# Patient Record
Sex: Female | Born: 1970 | Race: White | Hispanic: Yes | Smoking: Never smoker
Health system: Southern US, Community
[De-identification: ages and names within clinical notes are randomized; demographics above are authoritative.]

## PROBLEM LIST (undated history)

## (undated) DIAGNOSIS — K219 Gastro-esophageal reflux disease without esophagitis: Secondary | ICD-10-CM

## (undated) DIAGNOSIS — I82409 Acute embolism and thrombosis of unspecified deep veins of unspecified lower extremity: Secondary | ICD-10-CM

## (undated) DIAGNOSIS — E119 Type 2 diabetes mellitus without complications: Secondary | ICD-10-CM

## (undated) DIAGNOSIS — E785 Hyperlipidemia, unspecified: Secondary | ICD-10-CM

## (undated) HISTORY — DX: Gastro-esophageal reflux disease without esophagitis: K21.9

## (undated) HISTORY — DX: Type 2 diabetes mellitus without complications: E11.9

## (undated) HISTORY — DX: Hyperlipidemia, unspecified: E78.5

## (undated) HISTORY — PX: PARTIAL HYSTERECTOMY: SHX80

## (undated) HISTORY — DX: Acute embolism and thrombosis of unspecified deep veins of unspecified lower extremity: I82.409

## (undated) HISTORY — PX: VENA CAVA PLICATION: SHX2654

## (undated) HISTORY — PX: TUBAL LIGATION: SHX77

---

## 2019-05-05 DIAGNOSIS — R8290 Unspecified abnormal findings in urine: Secondary | ICD-10-CM | POA: Diagnosis not present

## 2019-05-05 DIAGNOSIS — Z7901 Long term (current) use of anticoagulants: Secondary | ICD-10-CM | POA: Diagnosis not present

## 2019-05-05 DIAGNOSIS — D6859 Other primary thrombophilia: Secondary | ICD-10-CM | POA: Diagnosis not present

## 2019-05-05 DIAGNOSIS — Z5181 Encounter for therapeutic drug level monitoring: Secondary | ICD-10-CM | POA: Diagnosis not present

## 2019-05-05 DIAGNOSIS — E119 Type 2 diabetes mellitus without complications: Secondary | ICD-10-CM | POA: Diagnosis not present

## 2019-06-16 DIAGNOSIS — D6859 Other primary thrombophilia: Secondary | ICD-10-CM | POA: Diagnosis not present

## 2019-06-16 DIAGNOSIS — Z7901 Long term (current) use of anticoagulants: Secondary | ICD-10-CM | POA: Diagnosis not present

## 2019-06-16 DIAGNOSIS — Z5181 Encounter for therapeutic drug level monitoring: Secondary | ICD-10-CM | POA: Diagnosis not present

## 2019-06-16 DIAGNOSIS — R300 Dysuria: Secondary | ICD-10-CM | POA: Diagnosis not present

## 2019-07-21 DIAGNOSIS — Z5181 Encounter for therapeutic drug level monitoring: Secondary | ICD-10-CM | POA: Diagnosis not present

## 2019-07-21 DIAGNOSIS — Z6827 Body mass index (BMI) 27.0-27.9, adult: Secondary | ICD-10-CM | POA: Diagnosis not present

## 2019-07-21 DIAGNOSIS — D6859 Other primary thrombophilia: Secondary | ICD-10-CM | POA: Diagnosis not present

## 2019-07-21 DIAGNOSIS — Z7901 Long term (current) use of anticoagulants: Secondary | ICD-10-CM | POA: Diagnosis not present

## 2019-07-21 DIAGNOSIS — E782 Mixed hyperlipidemia: Secondary | ICD-10-CM | POA: Diagnosis not present

## 2019-07-21 DIAGNOSIS — E119 Type 2 diabetes mellitus without complications: Secondary | ICD-10-CM | POA: Diagnosis not present

## 2019-08-18 DIAGNOSIS — Z7901 Long term (current) use of anticoagulants: Secondary | ICD-10-CM | POA: Diagnosis not present

## 2019-08-18 DIAGNOSIS — E782 Mixed hyperlipidemia: Secondary | ICD-10-CM | POA: Diagnosis not present

## 2019-08-18 DIAGNOSIS — E119 Type 2 diabetes mellitus without complications: Secondary | ICD-10-CM | POA: Diagnosis not present

## 2019-08-18 DIAGNOSIS — D6859 Other primary thrombophilia: Secondary | ICD-10-CM | POA: Diagnosis not present

## 2019-08-18 DIAGNOSIS — Z5181 Encounter for therapeutic drug level monitoring: Secondary | ICD-10-CM | POA: Diagnosis not present

## 2019-09-01 DIAGNOSIS — D6859 Other primary thrombophilia: Secondary | ICD-10-CM | POA: Diagnosis not present

## 2019-09-01 DIAGNOSIS — Z6828 Body mass index (BMI) 28.0-28.9, adult: Secondary | ICD-10-CM | POA: Diagnosis not present

## 2019-09-01 DIAGNOSIS — Z5181 Encounter for therapeutic drug level monitoring: Secondary | ICD-10-CM | POA: Diagnosis not present

## 2019-09-01 DIAGNOSIS — Z7901 Long term (current) use of anticoagulants: Secondary | ICD-10-CM | POA: Diagnosis not present

## 2019-09-05 DIAGNOSIS — Z5181 Encounter for therapeutic drug level monitoring: Secondary | ICD-10-CM | POA: Diagnosis not present

## 2019-09-05 DIAGNOSIS — D6859 Other primary thrombophilia: Secondary | ICD-10-CM | POA: Diagnosis not present

## 2019-09-05 DIAGNOSIS — Z7901 Long term (current) use of anticoagulants: Secondary | ICD-10-CM | POA: Diagnosis not present

## 2019-10-03 DIAGNOSIS — Z5181 Encounter for therapeutic drug level monitoring: Secondary | ICD-10-CM | POA: Diagnosis not present

## 2019-10-03 DIAGNOSIS — Z6829 Body mass index (BMI) 29.0-29.9, adult: Secondary | ICD-10-CM | POA: Diagnosis not present

## 2019-10-03 DIAGNOSIS — Z7901 Long term (current) use of anticoagulants: Secondary | ICD-10-CM | POA: Diagnosis not present

## 2019-10-03 DIAGNOSIS — D6859 Other primary thrombophilia: Secondary | ICD-10-CM | POA: Diagnosis not present

## 2019-10-31 DIAGNOSIS — Z5181 Encounter for therapeutic drug level monitoring: Secondary | ICD-10-CM | POA: Diagnosis not present

## 2019-10-31 DIAGNOSIS — Z7901 Long term (current) use of anticoagulants: Secondary | ICD-10-CM | POA: Diagnosis not present

## 2019-10-31 DIAGNOSIS — D6859 Other primary thrombophilia: Secondary | ICD-10-CM | POA: Diagnosis not present

## 2019-10-31 DIAGNOSIS — Z6828 Body mass index (BMI) 28.0-28.9, adult: Secondary | ICD-10-CM | POA: Diagnosis not present

## 2019-12-05 DIAGNOSIS — Z6829 Body mass index (BMI) 29.0-29.9, adult: Secondary | ICD-10-CM | POA: Diagnosis not present

## 2019-12-05 DIAGNOSIS — Z5181 Encounter for therapeutic drug level monitoring: Secondary | ICD-10-CM | POA: Diagnosis not present

## 2019-12-05 DIAGNOSIS — Z23 Encounter for immunization: Secondary | ICD-10-CM | POA: Diagnosis not present

## 2019-12-05 DIAGNOSIS — D6859 Other primary thrombophilia: Secondary | ICD-10-CM | POA: Diagnosis not present

## 2019-12-05 DIAGNOSIS — Z7901 Long term (current) use of anticoagulants: Secondary | ICD-10-CM | POA: Diagnosis not present

## 2019-12-05 DIAGNOSIS — E782 Mixed hyperlipidemia: Secondary | ICD-10-CM | POA: Diagnosis not present

## 2019-12-05 DIAGNOSIS — E119 Type 2 diabetes mellitus without complications: Secondary | ICD-10-CM | POA: Diagnosis not present

## 2020-05-06 DIAGNOSIS — Z6828 Body mass index (BMI) 28.0-28.9, adult: Secondary | ICD-10-CM | POA: Diagnosis not present

## 2020-05-06 DIAGNOSIS — R300 Dysuria: Secondary | ICD-10-CM | POA: Diagnosis not present

## 2020-05-06 DIAGNOSIS — R509 Fever, unspecified: Secondary | ICD-10-CM | POA: Diagnosis not present

## 2020-05-06 DIAGNOSIS — Z20822 Contact with and (suspected) exposure to covid-19: Secondary | ICD-10-CM | POA: Diagnosis not present

## 2020-05-14 DIAGNOSIS — E119 Type 2 diabetes mellitus without complications: Secondary | ICD-10-CM | POA: Diagnosis not present

## 2020-05-14 DIAGNOSIS — E782 Mixed hyperlipidemia: Secondary | ICD-10-CM | POA: Diagnosis not present

## 2020-05-24 DIAGNOSIS — D6859 Other primary thrombophilia: Secondary | ICD-10-CM | POA: Diagnosis not present

## 2020-05-24 DIAGNOSIS — E1169 Type 2 diabetes mellitus with other specified complication: Secondary | ICD-10-CM | POA: Diagnosis not present

## 2020-05-24 DIAGNOSIS — Z20822 Contact with and (suspected) exposure to covid-19: Secondary | ICD-10-CM | POA: Diagnosis not present

## 2020-05-24 DIAGNOSIS — Z23 Encounter for immunization: Secondary | ICD-10-CM | POA: Diagnosis not present

## 2020-05-24 DIAGNOSIS — R059 Cough, unspecified: Secondary | ICD-10-CM | POA: Diagnosis not present

## 2020-05-24 DIAGNOSIS — E782 Mixed hyperlipidemia: Secondary | ICD-10-CM | POA: Diagnosis not present

## 2020-05-24 DIAGNOSIS — E785 Hyperlipidemia, unspecified: Secondary | ICD-10-CM | POA: Diagnosis not present

## 2020-09-24 DIAGNOSIS — E782 Mixed hyperlipidemia: Secondary | ICD-10-CM | POA: Diagnosis not present

## 2020-09-24 DIAGNOSIS — E1169 Type 2 diabetes mellitus with other specified complication: Secondary | ICD-10-CM | POA: Diagnosis not present

## 2020-10-04 DIAGNOSIS — E782 Mixed hyperlipidemia: Secondary | ICD-10-CM | POA: Diagnosis not present

## 2020-10-04 DIAGNOSIS — E1169 Type 2 diabetes mellitus with other specified complication: Secondary | ICD-10-CM | POA: Diagnosis not present

## 2020-10-04 DIAGNOSIS — E785 Hyperlipidemia, unspecified: Secondary | ICD-10-CM | POA: Diagnosis not present

## 2020-10-04 DIAGNOSIS — D6859 Other primary thrombophilia: Secondary | ICD-10-CM | POA: Diagnosis not present

## 2021-01-21 DIAGNOSIS — E1169 Type 2 diabetes mellitus with other specified complication: Secondary | ICD-10-CM | POA: Diagnosis not present

## 2021-01-21 DIAGNOSIS — E782 Mixed hyperlipidemia: Secondary | ICD-10-CM | POA: Diagnosis not present

## 2021-02-16 DIAGNOSIS — Z6828 Body mass index (BMI) 28.0-28.9, adult: Secondary | ICD-10-CM | POA: Diagnosis not present

## 2021-02-16 DIAGNOSIS — Z0000 Encounter for general adult medical examination without abnormal findings: Secondary | ICD-10-CM | POA: Diagnosis not present

## 2021-02-16 DIAGNOSIS — Z1211 Encounter for screening for malignant neoplasm of colon: Secondary | ICD-10-CM | POA: Diagnosis not present

## 2021-02-16 DIAGNOSIS — Z1231 Encounter for screening mammogram for malignant neoplasm of breast: Secondary | ICD-10-CM | POA: Diagnosis not present

## 2021-03-16 ENCOUNTER — Other Ambulatory Visit: Payer: Self-pay | Admitting: Family Medicine

## 2021-03-16 DIAGNOSIS — Z1231 Encounter for screening mammogram for malignant neoplasm of breast: Secondary | ICD-10-CM

## 2021-03-23 ENCOUNTER — Ambulatory Visit
Admission: RE | Admit: 2021-03-23 | Discharge: 2021-03-23 | Disposition: A | Discharge status: Home / Self Care | Payer: BC Managed Care – PPO | Source: Ambulatory Visit | Attending: Family Medicine | Admitting: Family Medicine

## 2021-03-23 ENCOUNTER — Other Ambulatory Visit: Payer: Self-pay

## 2021-03-23 ENCOUNTER — Telehealth: Payer: Self-pay

## 2021-03-23 DIAGNOSIS — Z1231 Encounter for screening mammogram for malignant neoplasm of breast: Secondary | ICD-10-CM | POA: Diagnosis not present

## 2021-05-19 DIAGNOSIS — E1169 Type 2 diabetes mellitus with other specified complication: Secondary | ICD-10-CM | POA: Diagnosis not present

## 2021-06-08 DIAGNOSIS — R319 Hematuria, unspecified: Secondary | ICD-10-CM | POA: Diagnosis not present

## 2021-06-08 DIAGNOSIS — Z23 Encounter for immunization: Secondary | ICD-10-CM | POA: Diagnosis not present

## 2021-06-08 DIAGNOSIS — E1169 Type 2 diabetes mellitus with other specified complication: Secondary | ICD-10-CM | POA: Diagnosis not present

## 2021-06-08 DIAGNOSIS — Z1331 Encounter for screening for depression: Secondary | ICD-10-CM | POA: Diagnosis not present

## 2021-06-08 DIAGNOSIS — E785 Hyperlipidemia, unspecified: Secondary | ICD-10-CM | POA: Diagnosis not present

## 2021-06-08 DIAGNOSIS — E782 Mixed hyperlipidemia: Secondary | ICD-10-CM | POA: Diagnosis not present

## 2021-07-20 ENCOUNTER — Telehealth: Payer: Self-pay | Admitting: Oncology

## 2021-07-20 NOTE — Telephone Encounter (Signed)
Pt has been scheduled. Aware of appt date and time.

## 2021-08-03 NOTE — Progress Notes (Signed)
[facility]  [address] [place],  [place]  [address] [phone]  Clinic Day:  08/04/2021  Referring physician: Nehemiah Settle, MD   HISTORY OF PRESENT ILLNESS:  The patient is a 51 y.o. female who I was asked to consult upon for anticoagulation management in the setting of an upcoming procedure.  This patient has a history of recurrent DVTs.  In 1994, she recalls developing an unprovoked blood clot, which led to her being placed on Coumadin for approximately 1 year.  In 1999, a second unprovoked blood clot developed, which led to her being placed back on Coumadin.  Furthermore, an IVC filter was placed due to her second blood clot developing in an unprovoked manner.  She was on Coumadin for numerous years before being switched to Xarelto approximately 2 years ago.  The patient is scheduled to undergo a colonoscopy in the forthcoming weeks.  This visit was primarily made to determine how best to withdraw her anticoagulation in the setting of that upcoming procedure.  With respect to her blood clots, she has not another blood clot since 1999.  To her knowledge, there is no family history of blood clots.  Although she is scheduled for an upcoming colonoscopy, she denies having a history of rectal bleeding, anemia, or other symptoms/findings which concern her for a lower GI tract process being present.  PAST MEDICAL HISTORY:   Past Medical History:  Diagnosis Date   Diabetes mellitus without complication (HCC)    DVT (deep venous thrombosis) (HCC)    GERD (gastroesophageal reflux disease)    Hyperlipidemia     PAST SURGICAL HISTORY:   Past Surgical History:  Procedure Laterality Date   CESAREAN SECTION     X 2   PARTIAL HYSTERECTOMY     TUBAL LIGATION Bilateral    VENA CAVA PLICATION      CURRENT MEDICATIONS:   Current Outpatient Medications  Medication Sig Dispense Refill   atorvastatin (LIPITOR) 10 MG tablet Take 10 mg by mouth daily.      JARDIANCE 25 MG TABS tablet Take 25 mg by mouth daily.     metFORMIN (GLUCOPHAGE) 500 MG tablet Take 500 mg by mouth 2 (two) times daily.     pantoprazole (PROTONIX) 40 MG tablet Take 40 mg by mouth daily.     XARELTO 10 MG TABS tablet Take 10 mg by mouth daily.     No current facility-administered medications for this visit.    ALLERGIES:  No Known Allergies  FAMILY HISTORY:   Family History  Problem Relation Age of Onset   Diabetes Mother    Parkinson's disease Father    Thyroid disease Sister    Breast cancer Neg Hx     SOCIAL HISTORY:  The patient was born and raised in [place].  She currently lives in town.  She has separated, with 2 children.  She does sewing work.  There is no history of alcoholism or tobacco abuse.  REVIEW OF SYSTEMS:  Review of Systems  Constitutional:  Negative for fatigue and fever.  HENT:   Negative for hearing loss and sore throat.   Eyes:  Negative for eye problems.  Respiratory:  Negative for chest tightness, cough and hemoptysis.   Cardiovascular:  Negative for chest pain and palpitations.  Gastrointestinal:  Negative for abdominal distention, abdominal pain, blood in stool, constipation, diarrhea, nausea and vomiting.  Endocrine: Negative for hot flashes.  Genitourinary:  Negative for difficulty urinating, dysuria, frequency, hematuria and nocturia.  Musculoskeletal:  Negative for arthralgias, back pain, gait problem and myalgias.  Skin: Negative.  Negative for itching and rash.  Neurological: Negative.  Negative for dizziness, extremity weakness, gait problem, headaches, light-headedness and numbness.  Hematological: Negative.   Psychiatric/Behavioral: Negative.  Negative for depression and suicidal ideas. The patient is not nervous/anxious.     PHYSICAL EXAM:  Blood pressure 118/69, pulse 77, temperature 98.7 F (37.1 C), resp. rate 14, height 5\' 3"  (1.6 m), weight 172 lb (78 kg), SpO2 100 %. Wt Readings from Last 3 Encounters:  08/04/21  172 lb (78 kg)   Body mass index is 30.47 kg/m. Performance status (ECOG): 0 - Asymptomatic Physical Exam Constitutional:      Appearance: Normal appearance. She is not ill-appearing.  HENT:     Mouth/Throat:     Mouth: Mucous membranes are moist.     Pharynx: Oropharynx is clear. No oropharyngeal exudate or posterior oropharyngeal erythema.  Cardiovascular:     Rate and Rhythm: Normal rate and regular rhythm.     Heart sounds: No murmur heard.   No friction rub. No gallop.  Pulmonary:     Effort: Pulmonary effort is normal. No respiratory distress.     Breath sounds: Normal breath sounds. No wheezing, rhonchi or rales.  Abdominal:     General: Bowel sounds are normal. There is no distension.     Palpations: Abdomen is soft. There is no mass.     Tenderness: There is no abdominal tenderness.  Musculoskeletal:        General: No swelling.     Right lower leg: No edema.     Left lower leg: No edema.  Lymphadenopathy:     Cervical: No cervical adenopathy.     Upper Body:     Right upper body: No supraclavicular or axillary adenopathy.     Left upper body: No supraclavicular or axillary adenopathy.     Lower Body: No right inguinal adenopathy. No left inguinal adenopathy.  Skin:    General: Skin is warm.     Coloration: Skin is not jaundiced.     Findings: No lesion or rash.  Neurological:     General: No focal deficit present.     Mental Status: She is alert and oriented to person, place, and time. Mental status is at baseline.  Psychiatric:        Mood and Affect: Mood normal.        Behavior: Behavior normal.        Thought Content: Thought content normal.   ASSESSMENT & PLAN:  A 51 y.o. female who I was asked to consult upon for anticoagulation management in the setting of an upcoming procedure.  As mentioned previously, this patient has had 2 blood clots in her lifetime, with the last one being in 1999.  As she is on Xarelto, my recommendation would be for this medicine  to be stopped 2 days before her scheduled colonoscopy.  Once again, the patient reiterated that both of her blood clots developed in an unprovoked manner at a relatively young age.  Furthermore, she states that a hypercoagulable work-up was never done.  Although there is no strong family history of any blood clots, she requests testing be done to potentially rule out some form of hypercoagulable disorder being present.  I will acquiesce to her wishes today.  She will be screened for the following clotting disorders: Protein C deficiency, protein S deficiency, factor V Leiden mutation, prothrombin gene mutation, antithrombin deficiency, and  antiphospholipid syndrome.  I will see her back in 3 weeks to go over her hypercoagulable work-up and its implications.  The patient understands all the plans discussed today and is in agreement with them.  I do appreciate Nehemiah Settle, MD for his new consult.   Michaela Shankel Macarthur Critchley, MD

## 2021-08-04 ENCOUNTER — Inpatient Hospital Stay: Payer: BC Managed Care – PPO

## 2021-08-04 ENCOUNTER — Encounter: Payer: Self-pay | Admitting: Oncology

## 2021-08-04 ENCOUNTER — Telehealth: Payer: Self-pay | Admitting: Oncology

## 2021-08-04 ENCOUNTER — Inpatient Hospital Stay: Payer: BC Managed Care – PPO | Attending: Oncology | Admitting: Oncology

## 2021-08-04 ENCOUNTER — Other Ambulatory Visit: Payer: Self-pay | Admitting: Oncology

## 2021-08-04 DIAGNOSIS — Z79899 Other long term (current) drug therapy: Secondary | ICD-10-CM | POA: Diagnosis not present

## 2021-08-04 DIAGNOSIS — Z7901 Long term (current) use of anticoagulants: Secondary | ICD-10-CM | POA: Insufficient documentation

## 2021-08-04 DIAGNOSIS — Z86718 Personal history of other venous thrombosis and embolism: Secondary | ICD-10-CM | POA: Insufficient documentation

## 2021-08-04 DIAGNOSIS — I82402 Acute embolism and thrombosis of unspecified deep veins of left lower extremity: Secondary | ICD-10-CM

## 2021-08-04 DIAGNOSIS — Z7984 Long term (current) use of oral hypoglycemic drugs: Secondary | ICD-10-CM | POA: Insufficient documentation

## 2021-08-04 DIAGNOSIS — E119 Type 2 diabetes mellitus without complications: Secondary | ICD-10-CM | POA: Insufficient documentation

## 2021-08-04 NOTE — Telephone Encounter (Signed)
Patient has been scheduled for follow-up visit per 06/14/22 los. Pt given an appt calendar with date and time.  

## 2021-08-05 LAB — LUPUS ANTICOAGULANT
DRVVT: 41.1 s (ref 0.0–47.0)
PTT Lupus Anticoagulant: 41.1 s (ref 0.0–51.9)
Thrombin Time: 15.9 s (ref 0.0–23.0)
dPT Confirm Ratio: 1.02 Ratio (ref 0.00–1.34)
dPT: 34.7 s (ref 0.0–47.6)

## 2021-08-05 LAB — BETA-2-GLYCOPROTEIN I ABS, IGG/M/A
Beta-2 Glyco I IgG: <9 GPI IgG units (ref 0–20)
Beta-2-Glycoprotein I IgA: <9 GPI IgA units (ref 0–25)
Beta-2-Glycoprotein I IgM: <9 GPI IgM units (ref 0–32)

## 2021-08-05 LAB — CARDIOLIPIN ANTIBODIES, IGG, IGM, IGA
Anticardiolipin IgA: <9 APL U/mL (ref 0–11)
Anticardiolipin IgG: <9 GPL U/mL (ref 0–14)
Anticardiolipin IgM: 25 MPL U/mL — ABNORMAL HIGH (ref 0–12)

## 2021-08-05 LAB — PROTEIN S, TOTAL AND FREE
Protein S Ag, Free: 123 % (ref 61–136)
Protein S Ag, Total: 121 % (ref 60–150)

## 2021-08-05 LAB — ANTITHROMBIN PANEL
AT III AG PPP IMM-ACNC: 74 % (ref 72–124)
Antithrombin Activity: 124 % (ref 75–135)

## 2021-08-06 LAB — PROTEIN C ACTIVITY: Protein C Activity: 191 % — ABNORMAL HIGH (ref 73–180)

## 2021-08-07 LAB — PROTEIN C, TOTAL: Protein C, Total: 144 % (ref 60–150)

## 2021-08-08 DIAGNOSIS — I82409 Acute embolism and thrombosis of unspecified deep veins of unspecified lower extremity: Secondary | ICD-10-CM | POA: Insufficient documentation

## 2021-08-08 LAB — PROTHROMBIN GENE MUTATION

## 2021-08-17 DIAGNOSIS — J209 Acute bronchitis, unspecified: Secondary | ICD-10-CM | POA: Diagnosis not present

## 2021-08-17 DIAGNOSIS — Z20828 Contact with and (suspected) exposure to other viral communicable diseases: Secondary | ICD-10-CM | POA: Diagnosis not present

## 2021-08-17 DIAGNOSIS — R0981 Nasal congestion: Secondary | ICD-10-CM | POA: Diagnosis not present

## 2021-08-17 DIAGNOSIS — R051 Acute cough: Secondary | ICD-10-CM | POA: Diagnosis not present

## 2021-08-19 NOTE — Progress Notes (Signed)
[facility]  [address] [place],  [place]  [address] [phone]  Clinic Day:  08/25/2021  Referring physician: Maryella Shivers, MD  This document serves as a record of services personally performed by Marice Potter, MD. It was created on their behalf by [facility] E, a trained medical scribe. The creation of this record is based on the scribe's personal observations and the provider's statements to them.  HISTORY OF PRESENT ILLNESS:  The patient is a 51 y.o. female who I recently began seeing for a history of recurrent DVTs.  She comes in today to go over the results of her hypercoagulable work-up and their implications.  Since her last visit, the patient has been doing well.  She denies having any new symptoms/findings which concern her for a recurrent blood clot.  Her history dates back to 1994 when she recalls developing an unprovoked blood clot, which led to her being placed on Coumadin for approximately 1 year.  In 1999, a second unprovoked blood clot developed, which led to her being placed back on Coumadin.  Furthermore, an IVC filter was placed due to her second blood clot developing in an unprovoked manner.  She was on Coumadin for numerous years before being switched to Xarelto approximately 2 years ago.  She has had no further blood clot since 1999.  PHYSICAL EXAM:  Blood pressure 128/85, pulse 75, temperature 98.1 F (36.7 C), resp. rate 20, weight 171 lb (77.6 kg), SpO2 96 %. Wt Readings from Last 3 Encounters:  08/25/21 171 lb (77.6 kg)  08/04/21 172 lb (78 kg)   Body mass index is 30.29 kg/m. Performance status (ECOG): 0 - Asymptomatic Physical Exam Constitutional:      Appearance: Normal appearance. She is not ill-appearing.  HENT:     Mouth/Throat:     Mouth: Mucous membranes are moist.     Pharynx: Oropharynx is clear. No oropharyngeal exudate or posterior oropharyngeal erythema.  Cardiovascular:     Rate and Rhythm:  Normal rate and regular rhythm.     Heart sounds: No murmur heard.   No friction rub. No gallop.  Pulmonary:     Effort: Pulmonary effort is normal. No respiratory distress.     Breath sounds: Normal breath sounds. No wheezing, rhonchi or rales.  Abdominal:     General: Bowel sounds are normal. There is no distension.     Palpations: Abdomen is soft. There is no mass.     Tenderness: There is no abdominal tenderness.  Musculoskeletal:        General: No swelling.     Right lower leg: No edema.     Left lower leg: No edema.  Lymphadenopathy:     Cervical: No cervical adenopathy.     Upper Body:     Right upper body: No supraclavicular or axillary adenopathy.     Left upper body: No supraclavicular or axillary adenopathy.     Lower Body: No right inguinal adenopathy. No left inguinal adenopathy.  Skin:    General: Skin is warm.     Coloration: Skin is not jaundiced.     Findings: No lesion or rash.  Neurological:     General: No focal deficit present.     Mental Status: She is alert and oriented to person, place, and time. Mental status is at baseline.  Psychiatric:        Mood and Affect: Mood normal.        Behavior: Behavior normal.  Thought Content: Thought content normal.   LABS: Her hypercoagulable work-up revealed the following:  Latest Reference Range & Units 08/04/21 16:03  Anticardiolipin Ab,IgA,Qn 0 - 11 APL U/mL <9  Anticardiolipin Ab,IgG,Qn 0 - 14 GPL U/mL <9  Anticardiolipin Ab,IgM,Qn 0 - 12 MPL U/mL 25 (H)  PTT Lupus Anticoagulant 0.0 - 51.9 sec 41.1  DRVVT 0.0 - 47.0 sec 41.1  Lupus Anticoag Interp  Comment:  Beta-2 Glycoprotein I Ab, IgG 0 - 20 GPI IgG units <9  Beta-2-Glycoprotein I IgA 0 - 25 GPI IgA units <9  Beta-2-Glycoprotein I IgM 0 - 32 GPI IgM units <9  Antithrombin Activity 75 - 135 % 124  AT III AG PPP IMM-ACNC 72 - 124 % 74  Recommendations-PTGENE:  Comment  (H): Data is abnormally high  Latest Reference Range & Units 08/04/21 16:02  08/04/21 16:03  Protein C-Functional 73 - 180 %  191 (H)  Protein C, Total 60 - 150 %  144  Protein S, Free 61 - 136 % 123   Protein S, Total 60 - 150 % 121   (H): Data is abnormally high  ASSESSMENT & PLAN:  A 51 y.o. female who I recently began seeing due to a history of having unprovoked blood clots.  In clinic today, I went over the results from her hypercoagulable work-up with her.  The only test which came back mildly abnormal was her IgM anticardiolipin level.  This finding raises the suspicion for antiphospholipid syndrome being present.  However, I made sure the patient understood that such a lab value has to be abnormal on 2 separate occasions at least 12 weeks apart from 1 another in order for the diagnosis of antiphospholipid syndrome to be met.  Historically, when someone has antiphospholipid syndrome, I would expect such antibody levels to be much higher than in the mid 20s.  Nevertheless, I will recheck her anticardiolipin antibodies in another 3 months and will see her back around that time to review the results with her.  Irrespective of what these results show, if the patient needs any type of procedure/surgery, I would have no problem with it being done.  Her Xarelto would need to be  discontinued at least 2 days before the scheduled procedure.  I would not recommend any type of bridging anticoagulation.  The patient understands all the plans discussed today and is in agreement with them.  I, Rita Ohara, am acting as scribe for Marice Potter, MD    I have reviewed this report as typed by the medical scribe, and it is complete and accurate.  Faheem Ziemann Macarthur Critchley, MD

## 2021-08-25 ENCOUNTER — Inpatient Hospital Stay: Payer: BC Managed Care – PPO | Attending: Oncology | Admitting: Oncology

## 2021-08-25 ENCOUNTER — Other Ambulatory Visit: Payer: Self-pay

## 2021-08-25 VITALS — BP 128/85 | HR 75 | Temp 98.1°F | Resp 20 | Wt 171.0 lb

## 2021-08-25 DIAGNOSIS — I82401 Acute embolism and thrombosis of unspecified deep veins of right lower extremity: Secondary | ICD-10-CM

## 2021-09-09 DIAGNOSIS — Z1211 Encounter for screening for malignant neoplasm of colon: Secondary | ICD-10-CM | POA: Diagnosis not present

## 2021-09-12 DIAGNOSIS — E1169 Type 2 diabetes mellitus with other specified complication: Secondary | ICD-10-CM | POA: Diagnosis not present

## 2021-09-21 DIAGNOSIS — D6859 Other primary thrombophilia: Secondary | ICD-10-CM | POA: Diagnosis not present

## 2021-09-21 DIAGNOSIS — E1169 Type 2 diabetes mellitus with other specified complication: Secondary | ICD-10-CM | POA: Diagnosis not present

## 2021-09-21 DIAGNOSIS — E782 Mixed hyperlipidemia: Secondary | ICD-10-CM | POA: Diagnosis not present

## 2021-09-21 DIAGNOSIS — E785 Hyperlipidemia, unspecified: Secondary | ICD-10-CM | POA: Diagnosis not present

## 2021-11-02 ENCOUNTER — Inpatient Hospital Stay: Payer: BC Managed Care – PPO | Attending: Oncology

## 2021-11-09 NOTE — Progress Notes (Incomplete)
?[facility]  ?[address] ?[place],  [place]  [address] ?[phone] ? ?Clinic Day:  08/25/2021 ? ?Referring physician: Maryella Shivers, MD ? ?This document serves as a record of services personally performed by Marice Potter, MD. It was created on their behalf by Curry,Lauren E, a trained medical scribe. The creation of this record is based on the scribe's personal observations and the provider's statements to them. ? ?HISTORY OF PRESENT ILLNESS:  ?The patient is a 51 y.o. female who I recently began seeing for a history of recurrent DVTs.  She comes in today to go over the results of her hypercoagulable work-up and their implications.  Since her last visit, the patient has been doing well.  She denies having any new symptoms/findings which concern her for a recurrent blood clot. ? ?Her history dates back to 1994 when she recalls developing an unprovoked blood clot, which led to her being placed on Coumadin for approximately 1 year.  In 1999, a second unprovoked blood clot developed, which led to her being placed back on Coumadin.  Furthermore, an IVC filter was placed due to her second blood clot developing in an unprovoked manner.  She was on Coumadin for numerous years before being switched to Xarelto approximately 2 years ago.  She has had no further blood clot since 1999. ? ?PHYSICAL EXAM:  ?There were no vitals taken for this visit. ?Wt Readings from Last 3 Encounters:  ?08/25/21 171 lb (77.6 kg)  ?08/04/21 172 lb (78 kg)  ? ?There is no height or weight on file to calculate BMI. ?Performance status (ECOG): 0 - Asymptomatic ?Physical Exam ?Constitutional:   ?   Appearance: Normal appearance. She is not ill-appearing.  ?HENT:  ?   Mouth/Throat:  ?   Mouth: Mucous membranes are moist.  ?   Pharynx: Oropharynx is clear. No oropharyngeal exudate or posterior oropharyngeal erythema.  ?Cardiovascular:  ?   Rate and Rhythm: Normal rate and regular rhythm.  ?   Heart sounds:  No murmur heard. ?  No friction rub. No gallop.  ?Pulmonary:  ?   Effort: Pulmonary effort is normal. No respiratory distress.  ?   Breath sounds: Normal breath sounds. No wheezing, rhonchi or rales.  ?Abdominal:  ?   General: Bowel sounds are normal. There is no distension.  ?   Palpations: Abdomen is soft. There is no mass.  ?   Tenderness: There is no abdominal tenderness.  ?Musculoskeletal:     ?   General: No swelling.  ?   Right lower leg: No edema.  ?   Left lower leg: No edema.  ?Lymphadenopathy:  ?   Cervical: No cervical adenopathy.  ?   Upper Body:  ?   Right upper body: No supraclavicular or axillary adenopathy.  ?   Left upper body: No supraclavicular or axillary adenopathy.  ?   Lower Body: No right inguinal adenopathy. No left inguinal adenopathy.  ?Skin: ?   General: Skin is warm.  ?   Coloration: Skin is not jaundiced.  ?   Findings: No lesion or rash.  ?Neurological:  ?   General: No focal deficit present.  ?   Mental Status: She is alert and oriented to person, place, and time. Mental status is at baseline.  ?Psychiatric:     ?   Mood and Affect: Mood normal.     ?   Behavior: Behavior normal.     ?   Thought Content: Thought  content normal.  ? ?LABS: Her hypercoagulable work-up revealed the following: ? Latest Reference Range & Units 08/04/21 16:03  ?Anticardiolipin Ab,IgA,Qn 0 - 11 APL U/mL <9  ?Anticardiolipin Ab,IgG,Qn 0 - 14 GPL U/mL <9  ?Anticardiolipin Ab,IgM,Qn 0 - 12 MPL U/mL 25 (H)  ?PTT Lupus Anticoagulant 0.0 - 51.9 sec 41.1  ?DRVVT 0.0 - 47.0 sec 41.1  ?Lupus Anticoag Interp  Comment:  ?Beta-2 Glycoprotein I Ab, IgG 0 - 20 GPI IgG units <9  ?Beta-2-Glycoprotein I IgA 0 - 25 GPI IgA units <9  ?Beta-2-Glycoprotein I IgM 0 - 32 GPI IgM units <9  ?Antithrombin Activity 75 - 135 % 124  ?AT III AG PPP IMM-ACNC 72 - 124 % 74  ?Recommendations-PTGENE:  Comment  ?(H): Data is abnormally high ? Latest Reference Range & Units 08/04/21 16:02 08/04/21 16:03  ?Protein C-Functional 73 - 180 %  191  (H)  ?Protein C, Total 60 - 150 %  144  ?Protein S, Free 61 - 136 % 123   ?Protein S, Total 60 - 150 % 121   ?(H): Data is abnormally high ? ?ASSESSMENT & PLAN:  ?A 51 y.o. female who I recently began seeing due to a history of having unprovoked blood clots.  In clinic today, I went over the results from her hypercoagulable work-up with her.  The only test which came back mildly abnormal was her IgM anticardiolipin level.  This finding raises the suspicion for antiphospholipid syndrome being present.  However, I made sure the patient understood that such a lab value has to be abnormal on 2 separate occasions at least 12 weeks apart from 1 another in order for the diagnosis of antiphospholipid syndrome to be met.  Historically, when someone has antiphospholipid syndrome, I would expect such antibody levels to be much higher than in the mid 20s.  Nevertheless, I will recheck her anticardiolipin antibodies in another 3 months and will see her back around that time to review the results with her.  Irrespective of what these results show, if the patient needs any type of procedure/surgery, I would have no problem with it being done.  Her Xarelto would need to be  discontinued at least 2 days before the scheduled procedure.  I would not recommend any type of bridging anticoagulation.  The patient understands all the plans discussed today and is in agreement with them. ? ?I, Rita Ohara, am acting as scribe for Marice Potter, MD   ? ?I have reviewed this report as typed by the medical scribe, and it is complete and accurate. ? ?Lexia Vandevender Macarthur Critchley, MD ? ? ?  ?

## 2021-11-10 ENCOUNTER — Ambulatory Visit: Payer: BC Managed Care – PPO | Admitting: Oncology

## 2021-11-25 DIAGNOSIS — S80862A Insect bite (nonvenomous), left lower leg, initial encounter: Secondary | ICD-10-CM | POA: Diagnosis not present

## 2021-11-25 DIAGNOSIS — W57XXXA Bitten or stung by nonvenomous insect and other nonvenomous arthropods, initial encounter: Secondary | ICD-10-CM | POA: Diagnosis not present

## 2021-11-25 DIAGNOSIS — R21 Rash and other nonspecific skin eruption: Secondary | ICD-10-CM | POA: Diagnosis not present

## 2021-11-25 DIAGNOSIS — Z6830 Body mass index (BMI) 30.0-30.9, adult: Secondary | ICD-10-CM | POA: Diagnosis not present

## 2022-01-18 DIAGNOSIS — E1169 Type 2 diabetes mellitus with other specified complication: Secondary | ICD-10-CM | POA: Diagnosis not present

## 2022-01-25 DIAGNOSIS — Z6829 Body mass index (BMI) 29.0-29.9, adult: Secondary | ICD-10-CM | POA: Diagnosis not present

## 2022-01-25 DIAGNOSIS — E785 Hyperlipidemia, unspecified: Secondary | ICD-10-CM | POA: Diagnosis not present

## 2022-01-25 DIAGNOSIS — E1169 Type 2 diabetes mellitus with other specified complication: Secondary | ICD-10-CM | POA: Diagnosis not present

## 2022-01-25 DIAGNOSIS — E782 Mixed hyperlipidemia: Secondary | ICD-10-CM | POA: Diagnosis not present

## 2022-01-25 DIAGNOSIS — D6859 Other primary thrombophilia: Secondary | ICD-10-CM | POA: Diagnosis not present

## 2022-07-20 ENCOUNTER — Other Ambulatory Visit: Payer: Self-pay | Admitting: Family Medicine

## 2022-07-20 DIAGNOSIS — Z1231 Encounter for screening mammogram for malignant neoplasm of breast: Secondary | ICD-10-CM

## 2022-07-26 ENCOUNTER — Ambulatory Visit
Admission: RE | Admit: 2022-07-26 | Discharge: 2022-07-26 | Disposition: A | Discharge status: Home / Self Care | Payer: 59 | Source: Ambulatory Visit | Attending: Family Medicine | Admitting: Family Medicine

## 2022-07-26 DIAGNOSIS — Z1231 Encounter for screening mammogram for malignant neoplasm of breast: Secondary | ICD-10-CM

## 2022-12-27 IMAGING — MG MM DIGITAL SCREENING BILAT W/ TOMO AND CAD
8 series · 9 of 24 positions shown · non-contrast
Comparison: Previous exam(s).

CLINICAL DATA: Screening.

EXAM:
DIGITAL SCREENING BILATERAL MAMMOGRAM WITH TOMOSYNTHESIS AND CAD
TECHNIQUE: Bilateral screening digital craniocaudal and mediolateral oblique
mammograms were obtained. Bilateral screening digital breast
tomosynthesis was performed. The images were evaluated with
computer-aided detection.

[R CC synth-2D]
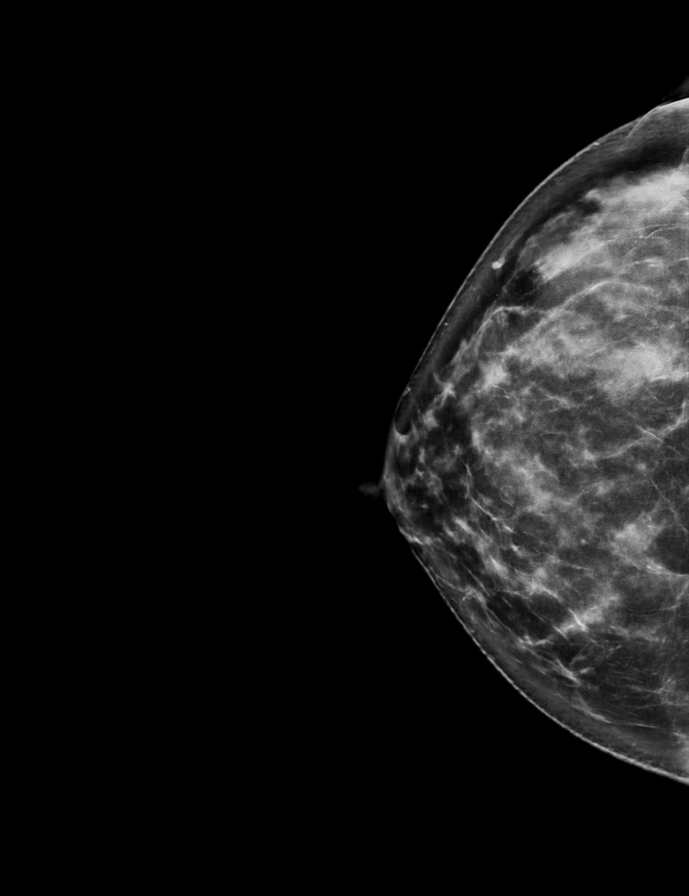

[L MLO synth-2D]
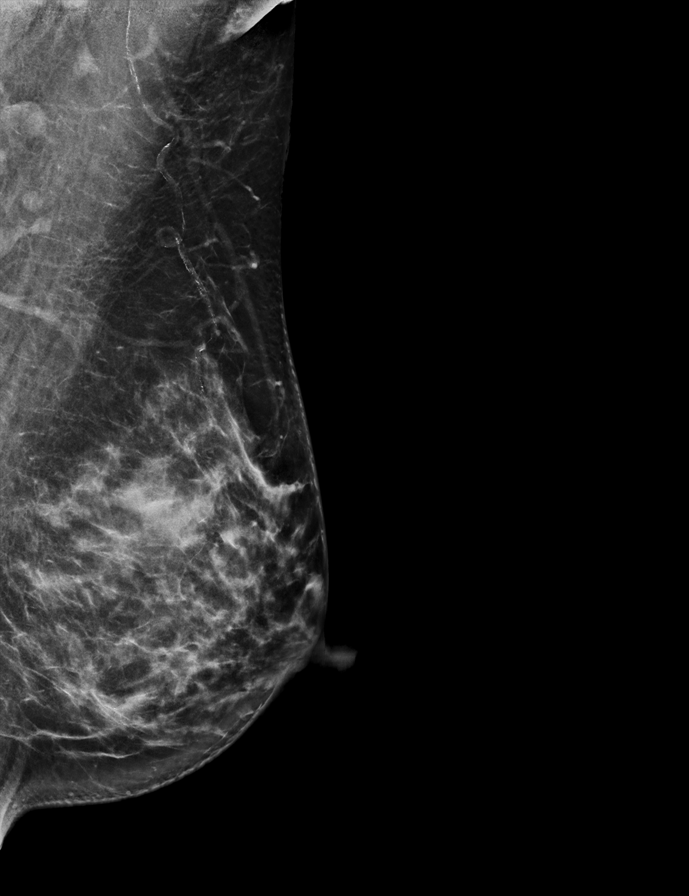

[L CC synth-2D]
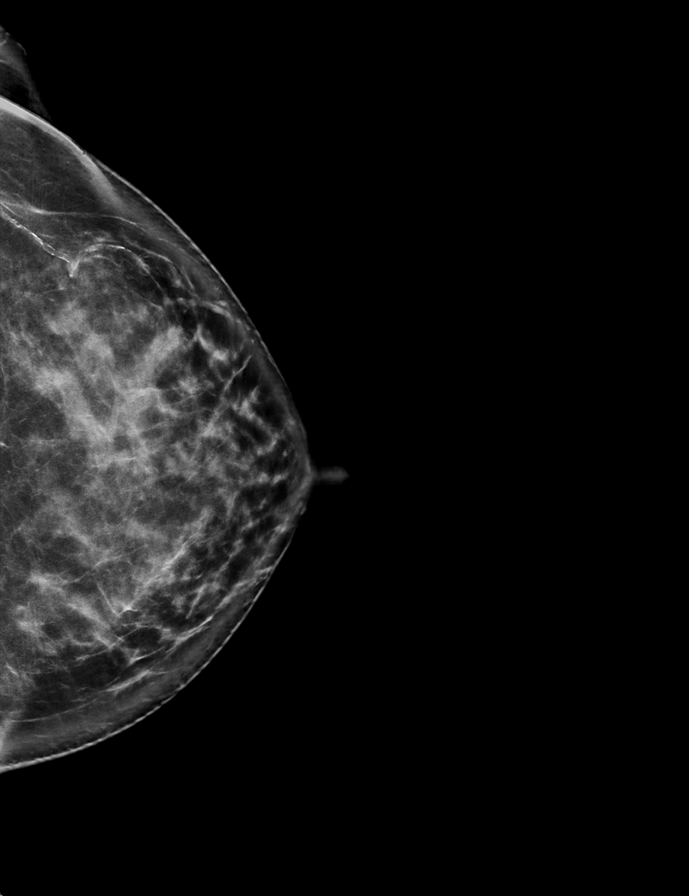

[R MLO synth-2D]
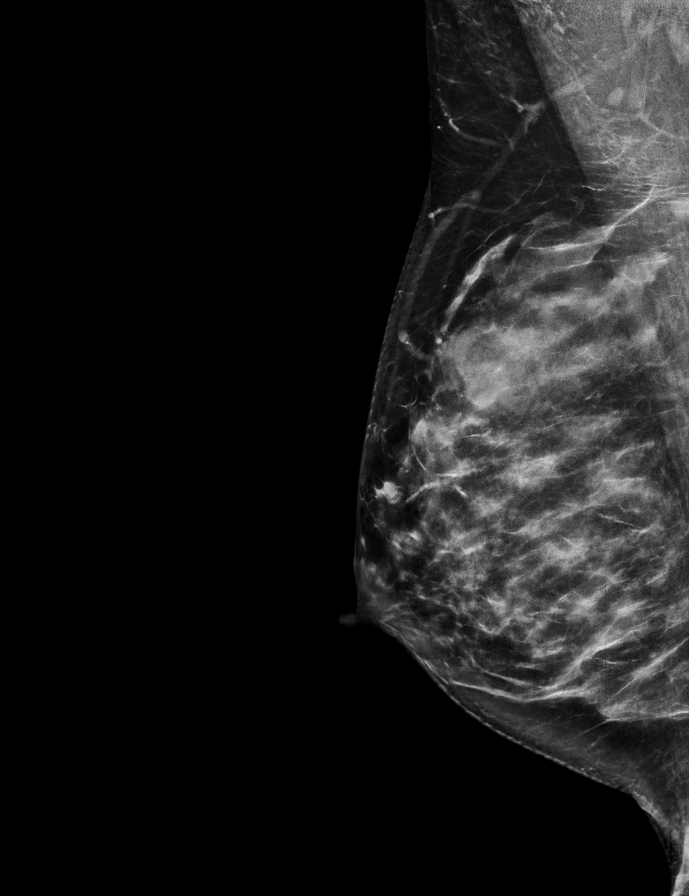

[R MLO tomo · 2 of 75 frames shown]
[frame 25/75]
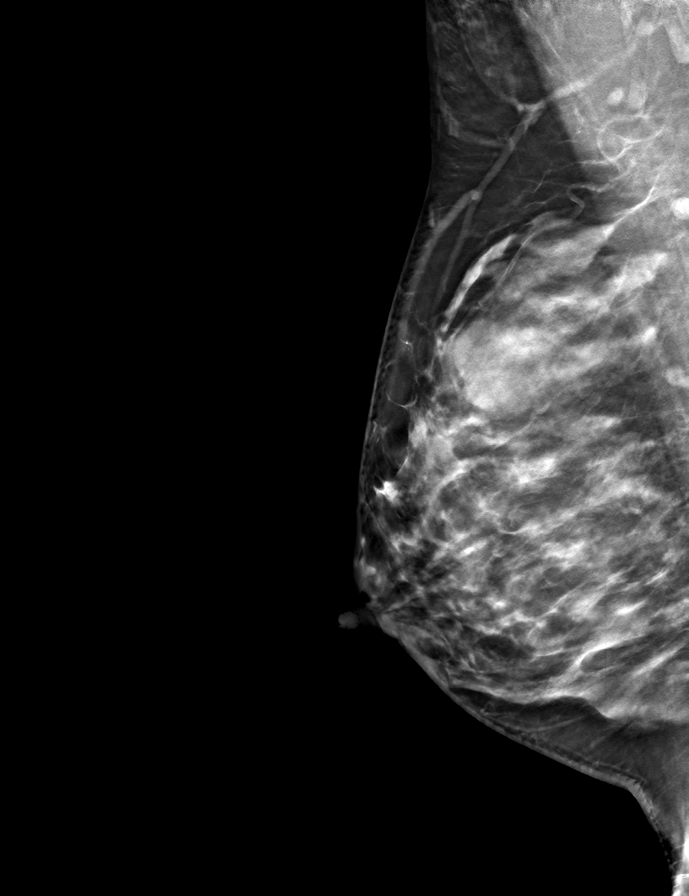
[frame 38/75]
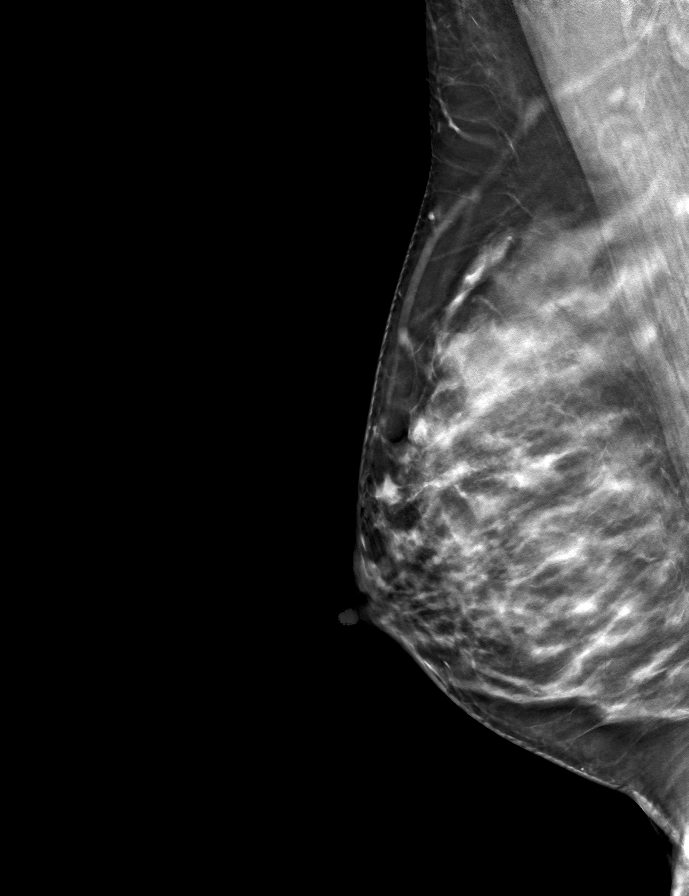

[L MLO tomo · tomo slice 37/73.0]
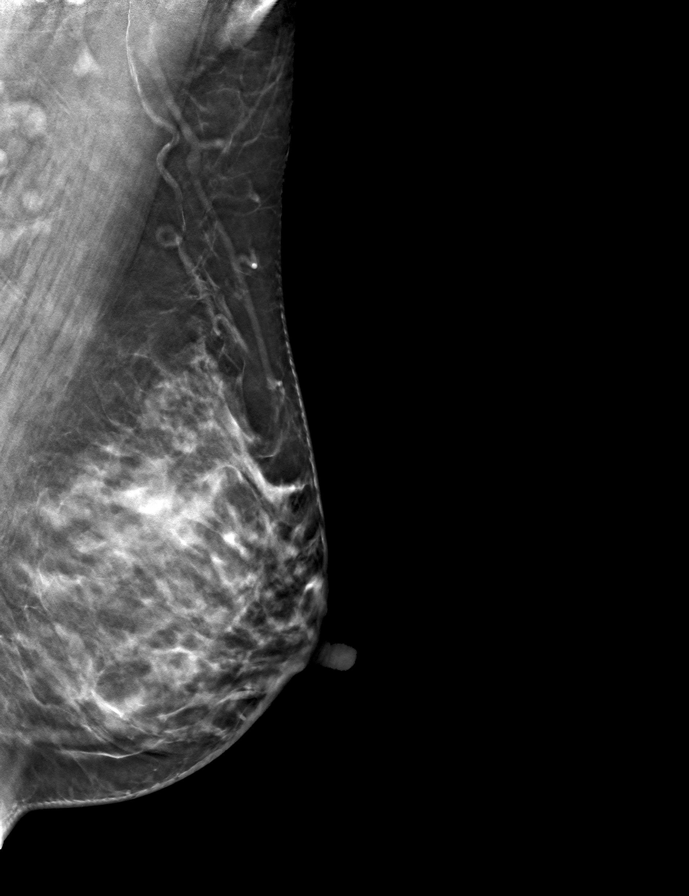

[R CC tomo · tomo slice 38/75.0]
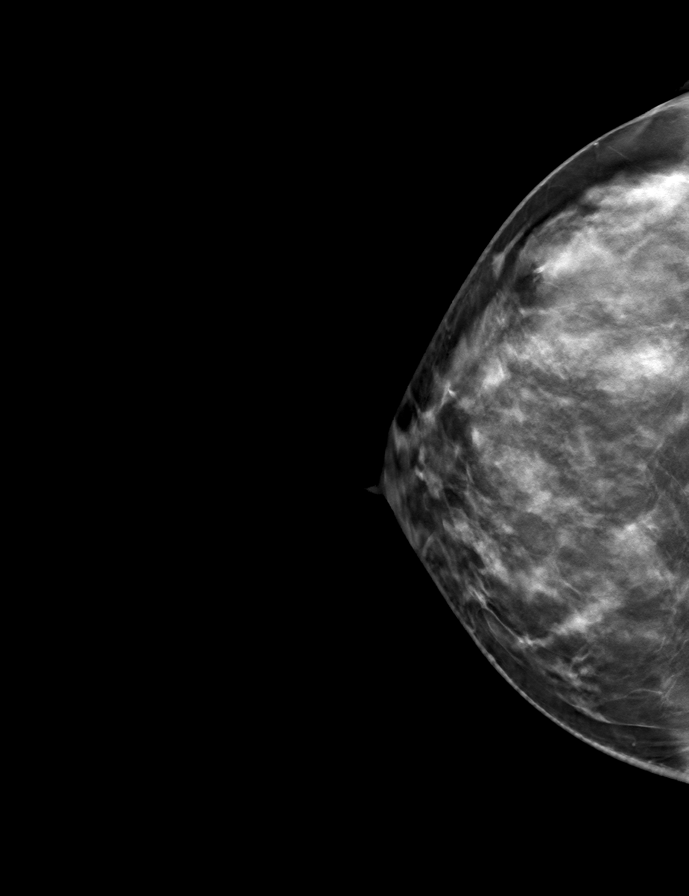

[L CC tomo · tomo slice 39/78.0]
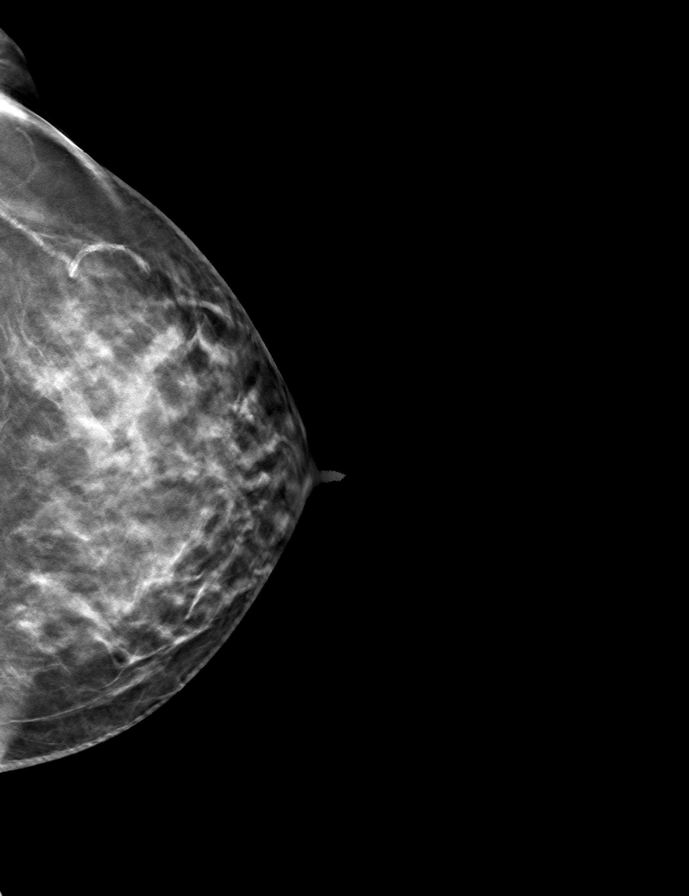

[9 of 24 positions shown; findings below may reference images not displayed]

ACR Breast Density Category c: The breast tissue is heterogeneously
dense, which may obscure small masses.
FINDINGS: There are no findings suspicious for malignancy.
IMPRESSION: No mammographic evidence of malignancy. A result letter of this
screening mammogram will be mailed directly to the patient.

RECOMMENDATION:
Screening mammogram in one year. (Code:Q3-W-BC3)

BI-RADS CATEGORY  1: Negative.

## 2023-10-12 ENCOUNTER — Other Ambulatory Visit: Payer: Self-pay | Admitting: Family Medicine

## 2023-10-12 DIAGNOSIS — Z1231 Encounter for screening mammogram for malignant neoplasm of breast: Secondary | ICD-10-CM

## 2023-10-17 ENCOUNTER — Ambulatory Visit
Admission: RE | Admit: 2023-10-17 | Discharge: 2023-10-17 | Disposition: A | Discharge status: Home / Self Care | Source: Ambulatory Visit | Attending: Family Medicine | Admitting: Family Medicine

## 2023-10-17 DIAGNOSIS — Z1231 Encounter for screening mammogram for malignant neoplasm of breast: Secondary | ICD-10-CM
